# Patient Record
Sex: Female | Born: 1990 | Hispanic: No | Marital: Married | Smoking: Never smoker
Health system: Southern US, Community
[De-identification: ages and names within clinical notes are randomized; demographics above are authoritative.]

---
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
## 2014-01-07 ENCOUNTER — Ambulatory Visit (INDEPENDENT_AMBULATORY_CARE_PROVIDER_SITE_OTHER): Payer: PRIVATE HEALTH INSURANCE | Admitting: Emergency Medicine

## 2014-01-07 VITALS — BP 114/68 | HR 64 | Temp 98.2°F | Resp 16 | Ht 67.5 in | Wt 188.2 lb

## 2014-01-07 DIAGNOSIS — J209 Acute bronchitis, unspecified: Secondary | ICD-10-CM

## 2014-01-07 DIAGNOSIS — J018 Other acute sinusitis: Secondary | ICD-10-CM

## 2014-01-07 MED ORDER — LEVOFLOXACIN 500 MG PO TABS: 500.0000 mg | ORAL_TABLET | Freq: Every day | ORAL | Status: AC

## 2014-01-07 MED ORDER — PSEUDOEPHEDRINE-GUAIFENESIN ER 60-600 MG PO TB12: 1.0000 | ORAL_TABLET | Freq: Two times a day (BID) | ORAL | Status: AC

## 2014-01-07 MED ORDER — PROMETHAZINE-CODEINE 6.25-10 MG/5ML PO SYRP: 5.0000 mL | ORAL_SOLUTION | Freq: Four times a day (QID) | ORAL | Status: AC | PRN

## 2014-01-07 NOTE — Progress Notes (Signed)
[facility] [address], [place] [id number] [phone]  Date:  01/07/2014   Name:  Lisa Armstrong   DOB:  [birth year]   MRN:  [id number]  PCP:  Angelica ChessmanAGUIAR,RAFAELA M., MD    Chief Complaint: Headache, Cough and Sore Throat   History of Present Illness:  Lisa Armstrong is a 23 y.o. very pleasant female patient who presents with the following:  1 1/2 week history of pain in throat and ears.  Has nasal congestion and postnasal drip but no drainage.  Cough that is not productive until today and now has a purulent sputum.  No wheezing or shortness of breath.  No fever or chills.  No nausea or vomiting no stool change.  No improvement with over the counter medications or other home remedies. Denies other complaint or health concern today.   There are no active problems to display for this patient.   History reviewed. No pertinent past medical history.  History reviewed. No pertinent past surgical history.  History  Substance Use Topics  . Smoking status: Never Smoker   . Smokeless tobacco: Not on file  . Alcohol Use: Yes    Family History  Problem Relation Age of Onset  . Hyperlipidemia Mother     Allergies  Allergen Reactions  . Penicillins     Medication list has been reviewed and updated.  No current outpatient prescriptions on file prior to visit.   No current facility-administered medications on file prior to visit.    Review of Systems:  As per HPI, otherwise negative.    Physical Examination: Filed Vitals:   01/07/14 0944  BP: 114/68  Pulse: 64  Temp: 98.2 F (36.8 C)  Resp: 16   Filed Vitals:   01/07/14 0944  Height: 5' 7.5" (1.715 m)  Weight: 188 lb 3.2 oz (85.367 kg)   Body mass index is 29.02 kg/(m^2). Ideal Body Weight: Weight in (lb) to have BMI = 25: 161.7  GEN: WDWN, NAD, Non-toxic, A & O x 3 HEENT: Atraumatic, Normocephalic. Neck supple. No masses, No LAD. Ears and Nose: No external deformity. CV: RRR, No M/G/R.  No JVD. No thrill. No extra heart sounds. PULM: CTA B, no wheezes, crackles, rhonchi. No retractions. No resp. distress. No accessory muscle use. ABD: S, NT, ND, +BS. No rebound. No HSM. EXTR: No c/c/e NEURO Normal gait.  PSYCH: Normally interactive. Conversant. Not depressed or anxious appearing.  Calm demeanor.    Assessment and Plan: Sinusitis Bronchitis levaquin mucinex d Phen c cod  Signed,  Phillips OdorJeffery Jessi Jessop, MD

## 2014-01-07 NOTE — Patient Instructions (Signed)

## 2015-12-27 ENCOUNTER — Emergency Department (HOSPITAL_COMMUNITY): Payer: 59

## 2015-12-27 ENCOUNTER — Encounter (HOSPITAL_COMMUNITY): Payer: Self-pay

## 2015-12-27 ENCOUNTER — Telehealth: Payer: Self-pay | Admitting: Surgery

## 2015-12-27 ENCOUNTER — Emergency Department (HOSPITAL_COMMUNITY)
Admission: EM | Admit: 2015-12-27 | Discharge: 2015-12-27 | Disposition: A | Discharge status: Home / Self Care | Payer: 59 | Attending: Emergency Medicine | Admitting: Emergency Medicine

## 2015-12-27 DIAGNOSIS — Z3202 Encounter for pregnancy test, result negative: Secondary | ICD-10-CM | POA: Insufficient documentation

## 2015-12-27 DIAGNOSIS — Z880 Allergy status to penicillin: Secondary | ICD-10-CM | POA: Diagnosis not present

## 2015-12-27 DIAGNOSIS — N390 Urinary tract infection, site not specified: Secondary | ICD-10-CM

## 2015-12-27 DIAGNOSIS — R197 Diarrhea, unspecified: Secondary | ICD-10-CM | POA: Diagnosis not present

## 2015-12-27 DIAGNOSIS — R112 Nausea with vomiting, unspecified: Secondary | ICD-10-CM | POA: Insufficient documentation

## 2015-12-27 DIAGNOSIS — R109 Unspecified abdominal pain: Secondary | ICD-10-CM | POA: Diagnosis present

## 2015-12-27 LAB — COMPREHENSIVE METABOLIC PANEL
ALK PHOS: 68 U/L (ref 38–126)
ALT: 16 U/L (ref 14–54)
ANION GAP: 9 (ref 5–15)
AST: 19 U/L (ref 15–41)
Albumin: 4.3 g/dL (ref 3.5–5.0)
BUN: 14 mg/dL (ref 6–20)
CALCIUM: 9.2 mg/dL (ref 8.9–10.3)
CO2: 29 mmol/L (ref 22–32)
CREATININE: 1.13 mg/dL — AB (ref 0.44–1.00)
Chloride: 101 mmol/L (ref 101–111)
Glucose, Bld: 105 mg/dL — ABNORMAL HIGH (ref 65–99)
Potassium: 3.8 mmol/L (ref 3.5–5.1)
SODIUM: 139 mmol/L (ref 135–145)
TOTAL PROTEIN: 8 g/dL (ref 6.5–8.1)
Total Bilirubin: 0.7 mg/dL (ref 0.3–1.2)

## 2015-12-27 LAB — CBC
HCT: 47.1 % — ABNORMAL HIGH (ref 36.0–46.0)
HEMOGLOBIN: 15.2 g/dL — AB (ref 12.0–15.0)
MCH: 29.7 pg (ref 26.0–34.0)
MCHC: 32.3 g/dL (ref 30.0–36.0)
MCV: 92 fL (ref 78.0–100.0)
PLATELETS: 333 10*3/uL (ref 150–400)
RBC: 5.12 MIL/uL — AB (ref 3.87–5.11)
RDW: 12.3 % (ref 11.5–15.5)
WBC: 6.5 10*3/uL (ref 4.0–10.5)

## 2015-12-27 LAB — URINALYSIS, ROUTINE W REFLEX MICROSCOPIC
Glucose, UA: NEGATIVE mg/dL
Ketones, ur: NEGATIVE mg/dL
Leukocytes, UA: NEGATIVE
Nitrite: NEGATIVE
Protein, ur: 30 mg/dL — AB
Specific Gravity, Urine: 1.036 — ABNORMAL HIGH (ref 1.005–1.030)
pH: 6 (ref 5.0–8.0)

## 2015-12-27 LAB — URINE MICROSCOPIC-ADD ON

## 2015-12-27 LAB — LIPASE, BLOOD: Lipase: 27 U/L (ref 11–51)

## 2015-12-27 LAB — POC URINE PREG, ED: Preg Test, Ur: NEGATIVE

## 2015-12-27 MED ORDER — IOPAMIDOL (ISOVUE-300) INJECTION 61%
100.0000 mL | Freq: Once | INTRAVENOUS | Status: AC | PRN
  Administered 2015-12-27: 100 mL via INTRAVENOUS

## 2015-12-27 MED ORDER — ONDANSETRON HCL 4 MG/2ML IJ SOLN
4.0000 mg | Freq: Once | INTRAMUSCULAR | Status: AC
  Administered 2015-12-27: 4 mg via INTRAVENOUS
  Filled 2015-12-27: qty 2

## 2015-12-27 MED ORDER — IOHEXOL 300 MG/ML  SOLN
25.0000 mL | Freq: Once | INTRAMUSCULAR | Status: AC | PRN
Start: 2015-12-27 — End: 2015-12-27
  Administered 2015-12-27: 25 mL via ORAL

## 2015-12-27 MED ORDER — CEPHALEXIN 500 MG PO CAPS: 500.0000 mg | ORAL_CAPSULE | Freq: Four times a day (QID) | ORAL | Status: DC

## 2015-12-27 MED ORDER — SODIUM CHLORIDE 0.9 % IV SOLN
INTRAVENOUS | Status: DC
  Administered 2015-12-27: 14:00:00 via INTRAVENOUS

## 2015-12-27 MED ORDER — MORPHINE SULFATE (PF) 4 MG/ML IV SOLN
4.0000 mg | Freq: Once | INTRAVENOUS | Status: AC
  Administered 2015-12-27: 4 mg via INTRAVENOUS
  Filled 2015-12-27: qty 1

## 2015-12-27 MED ORDER — SODIUM CHLORIDE 0.9 % IV BOLUS (SEPSIS)
500.0000 mL | Freq: Once | INTRAVENOUS | Status: AC
  Administered 2015-12-27: 500 mL via INTRAVENOUS

## 2015-12-27 NOTE — Discharge Instructions (Signed)

## 2015-12-27 NOTE — Telephone Encounter (Signed)
ED CM received [facility] regarding prescription written today for Keflex. Clarified Keflex prescription written for patient. By EDP. No further questions or concerns noted.

## 2015-12-27 NOTE — ED Provider Notes (Signed)
CSN: [id number]     Arrival date & time 12/27/15  4098 History   First MD Initiated Contact with Patient 12/27/15 1151     Chief Complaint  Patient presents with  . Abdominal Pain  . Emesis     (Consider location/radiation/quality/duration/timing/severity/associated sxs/prior Treatment) The history is provided by the patient.     Lisa Armstrong is a 25 y.o. female who presents for evaluation of low abdominal pain, bilateral, radiating to her lower back, present for 3 days. She had some diarrhea which improved after taking Imodium. She has had nausea with some vomiting, but none today. She saw a PCP yesterday and was started on Septra and Zofran for a suspected bladder infection. Her symptoms have continued today, so she came here. She checked her temperature at home, and it was normal. She denies cough, chest pain, weakness or dizziness. No prior similar problem. No history of kidney stones. She works in Engineering geologist. There are no other known modifying factors.  History reviewed. No pertinent past medical history. History reviewed. No pertinent past surgical history. Family History  Problem Relation Age of Onset  . Hyperlipidemia Mother    Social History  Substance Use Topics  . Smoking status: Never Smoker   . Smokeless tobacco: None  . Alcohol Use: Yes   OB History    No data available     Review of Systems  All other systems reviewed and are negative.     Allergies  Penicillins  Home Medications   Prior to Admission medications   Medication Sig Start Date End Date Taking? Authorizing Provider  levonorgestrel (MIRENA) 20 MCG/24HR IUD 1 each by Intrauterine route once.   Yes Historical Provider, MD  ondansetron (ZOFRAN-ODT) 8 MG disintegrating tablet Take 8 mg by mouth every 8 (eight) hours as needed for nausea or vomiting.   Yes Historical Provider, MD  cephALEXin (KEFLEX) 500 MG capsule Take 1 capsule (500 mg total) by mouth 4 (four) times daily. 12/27/15   Mancel Bale,  MD  promethazine-codeine (PHENERGAN WITH CODEINE) 6.25-10 MG/5ML syrup Take 5-10 mLs by mouth every 6 (six) hours as needed. Patient not taking: Reported on 12/27/2015 01/07/14   Carmelina Dane, MD   BP 139/70 mmHg  Pulse 68  Temp(Src) 98.1 F (36.7 C) (Oral)  Resp 18  SpO2 96%  LMP 12/18/2015 (Approximate) Physical Exam  Constitutional: She is oriented to person, place, and time. She appears well-developed and well-nourished. No distress.  HENT:  Head: Normocephalic and atraumatic.  Right Ear: External ear normal.  Left Ear: External ear normal.  Eyes: Conjunctivae and EOM are normal. Pupils are equal, round, and reactive to light.  Neck: Normal range of motion and phonation normal. Neck supple.  Cardiovascular: Normal rate, regular rhythm and normal heart sounds.   Pulmonary/Chest: Effort normal and breath sounds normal. She exhibits no bony tenderness.  Abdominal: Soft. She exhibits no distension and no mass. There is tenderness (Bilateral lower quadrants, mild). There is no rebound and no guarding.  Musculoskeletal: Normal range of motion. She exhibits no edema or tenderness.  Neurological: She is alert and oriented to person, place, and time. No cranial nerve deficit or sensory deficit. She exhibits normal muscle tone. Coordination normal.  Skin: Skin is warm, dry and intact.  Psychiatric: She has a normal mood and affect. Her behavior is normal. Judgment and thought content normal.  Nursing note and vitals reviewed.   ED Course  Procedures (including critical care time)  Medications  0.9 %  sodium  chloride infusion ( Intravenous Stopped 12/27/15 1550)  sodium chloride 0.9 % bolus 500 mL (0 mLs Intravenous Stopped 12/27/15 1348)  ondansetron (ZOFRAN) injection 4 mg (4 mg Intravenous Given 12/27/15 1230)  morphine 4 MG/ML injection 4 mg (4 mg Intravenous Given 12/27/15 1230)  iohexol (OMNIPAQUE) 300 MG/ML solution 25 mL (25 mLs Oral Contrast Given 12/27/15 1315)  iopamidol  (ISOVUE-300) 61 % injection 100 mL (100 mLs Intravenous Contrast Given 12/27/15 1420)    Patient Vitals for the past 24 hrs:  BP Temp Temp src Pulse Resp SpO2  12/27/15 1531 139/70 mmHg 98.1 F (36.7 C) Oral 68 18 96 %  12/27/15 1301 130/78 mmHg 98.3 F (36.8 C) Oral 99 18 100 %  12/27/15 0854 129/96 mmHg 98.1 F (36.7 C) Oral 105 18 99 %    3:22 PM Reevaluation with update and discussion. After initial assessment and treatment, an updated evaluation reveals She is more comfort now has no further complaints. Findings discussed with patient and all questions were answered. Lisa Armstrong L    Labs Review Labs Reviewed  COMPREHENSIVE METABOLIC PANEL - Abnormal; Notable for the following:    Glucose, Bld 105 (*)    Creatinine, Ser 1.13 (*)    All other components within normal limits  CBC - Abnormal; Notable for the following:    RBC 5.12 (*)    Hemoglobin 15.2 (*)    HCT 47.1 (*)    All other components within normal limits  URINALYSIS, ROUTINE W REFLEX MICROSCOPIC (NOT AT [facility]) - Abnormal; Notable for the following:    Color, Urine AMBER (*)    APPearance CLOUDY (*)    Specific Gravity, Urine 1.036 (*)    Hgb urine dipstick MODERATE (*)    Bilirubin Urine SMALL (*)    Protein, ur 30 (*)    All other components within normal limits  URINE MICROSCOPIC-ADD ON - Abnormal; Notable for the following:    Squamous Epithelial / LPF 0-5 (*)    Bacteria, UA MANY (*)    All other components within normal limits  URINE CULTURE  LIPASE, BLOOD  POC URINE PREG, ED    Imaging Review Ct Abdomen Pelvis W Contrast  12/27/2015  CLINICAL DATA:  Abdominal pain, vomiting and diarrhea since 12/24/2015. Hematuria. Initial encounter. EXAM: CT ABDOMEN AND PELVIS WITH CONTRAST TECHNIQUE: Multidetector CT imaging of the abdomen and pelvis was performed using the standard protocol following bolus administration of intravenous contrast. CONTRAST:  100 ml ISOVUE-300 IOPAMIDOL (ISOVUE-300) INJECTION 61%, 25  mL OMNIPAQUE IOHEXOL 300 MG/ML SOLN COMPARISON:  None. FINDINGS: Mild dependent atelectasis is seen in the lung bases. No pleural or pericardial effusion. Heart size is normal. A cyst in the midpole of the left kidney measures 0.9 cm in diameter. The kidneys otherwise appear normal. No hydronephrosis or urinary tract stones are identified. Ureters, urinary bladder and adnexa are unremarkable. IUD is in place. The liver, gallbladder, spleen, adrenal glands, pancreas and biliary tree all appear normal. The stomach, small and large bowel and appendix are unremarkable. No lymphadenopathy or fluid is identified. No bony abnormality is seen. IMPRESSION: Negative CT abdomen and pelvis. No finding to explain the patient's symptoms. Electronically Signed   By: Drusilla Kanner M.D.   On: 12/27/2015 14:35   I have personally reviewed and evaluated these images and lab results as part of my medical decision-making.   EKG Interpretation None      MDM   Final diagnoses:  Urinary tract infection without hematuria, site unspecified  Urinary tract infection without evidence for metabolic instability, sepsis or impending vascular collapse. Doubt pyelonephritis.   Nursing Notes Reviewed/ Care Coordinated Applicable Imaging Reviewed Interpretation of Laboratory Data incorporated into ED treatment  The patient appears reasonably screened and/or stabilized for discharge and I doubt any other medical condition or other [facility] requiring further screening, evaluation, or treatment in the ED at this time prior to discharge.  Plan: Home Medications- change medication to Keflex, DC Septra; Home Treatments- rest, gradually advance diet; return here if the recommended treatment, does not improve the symptoms; Recommended follow up- PCP for checkup in one week     Mancel BaleElliott Ladajah Soltys, MD 12/27/15 1553

## 2015-12-27 NOTE — ED Notes (Signed)
Pt presents with c/o abdominal pain, vomiting, and diarrhea that started on Saturday. Pt went to the doctor yesterday and was given nausea medication and reports that they detected some blood in her urine. Pt was given antibiotics for a possible bladder infection. Pt reports the symptoms have not resolved.

## 2015-12-28 LAB — URINE CULTURE: Special Requests: NORMAL

## 2017-03-08 IMAGING — CT CT ABD-PELV W/ CM
2 of 4 series · 17 of 46 positions shown, 19 images · IV contrast (OMNIPAQUE 300)
Comparison: None.

CLINICAL DATA: Abdominal pain, vomiting and diarrhea since
12/24/2015. Hematuria. Initial encounter.

EXAM:
CT ABDOMEN AND PELVIS WITH CONTRAST
TECHNIQUE: Multidetector CT imaging of the abdomen and pelvis was performed
using the standard protocol following bolus administration of
intravenous contrast.
CONTRAST:  100 ml HLEELR-QLL IOPAMIDOL (HLEELR-QLL) INJECTION 61%,
25 mL OMNIPAQUE IOHEXOL 300 MG/ML SOLN

[Series 2: abd/pel with · axial · 0.74mm/px · z∈[+1360,+1800]mm · 14 of 96 slices shown, 16 images]
[im 4/96  soft-tissue]
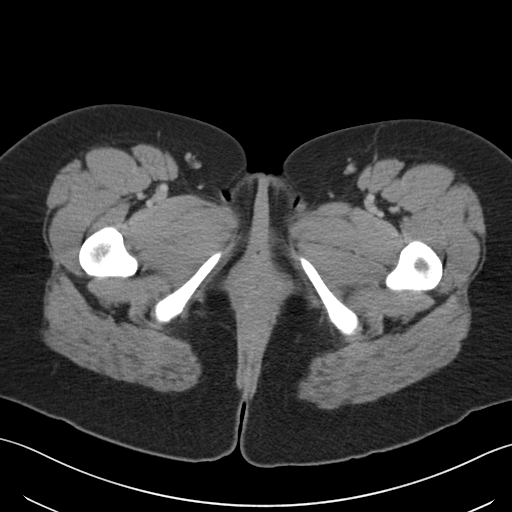
[im 4/96  bone]
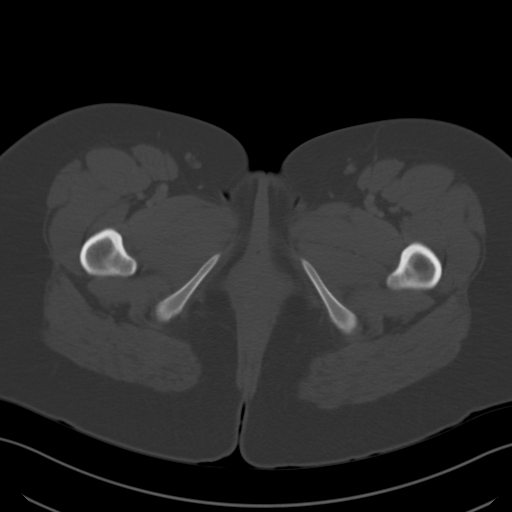
[im 12/96  soft-tissue]
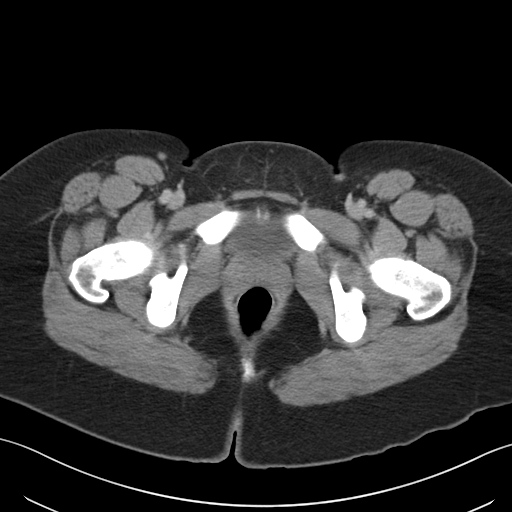
[im 20/96  soft-tissue]
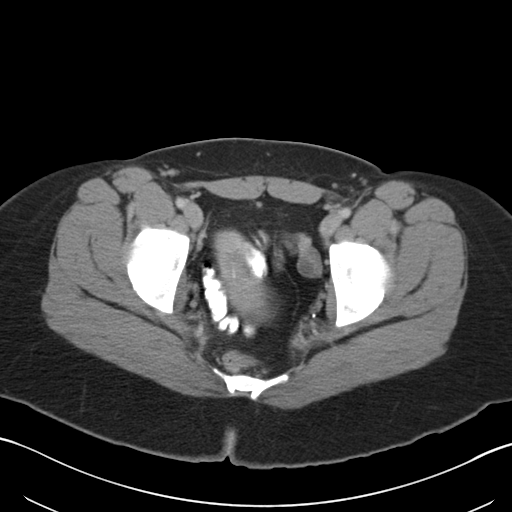
[im 27/96  soft-tissue]
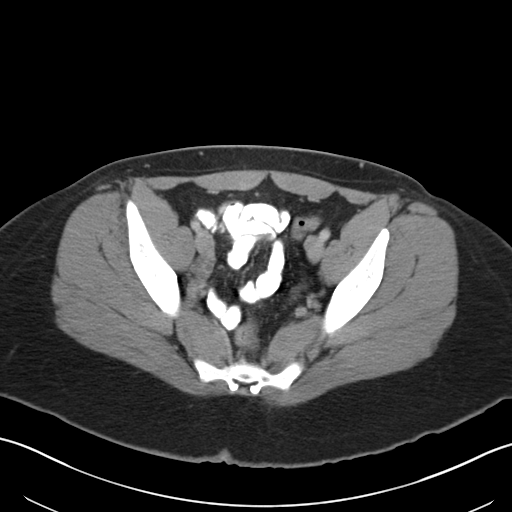
[im 31/96  soft-tissue]
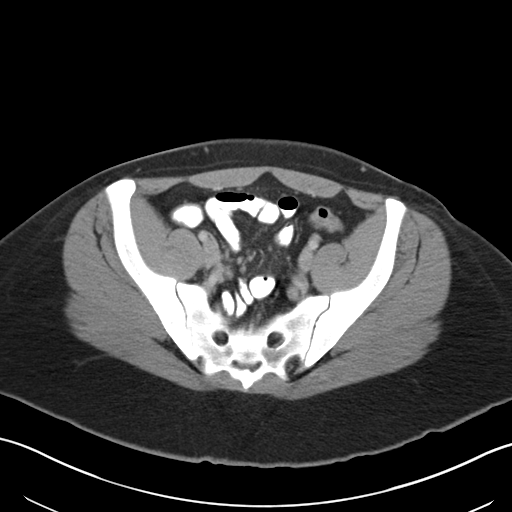
[im 39/96  soft-tissue]
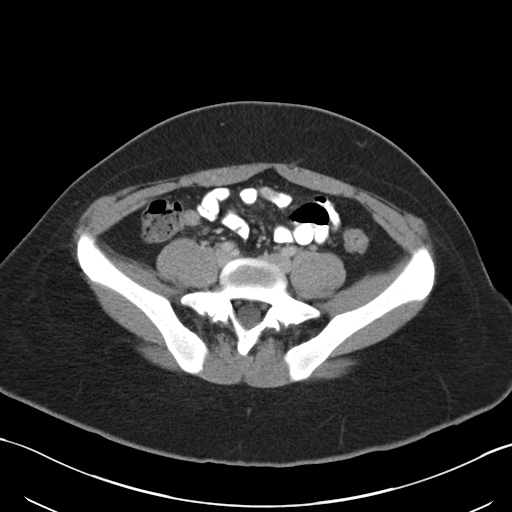
[im 46/96  soft-tissue]
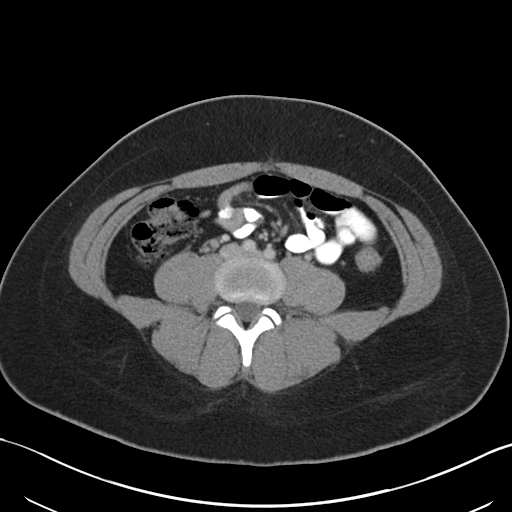
[im 50/96  soft-tissue]
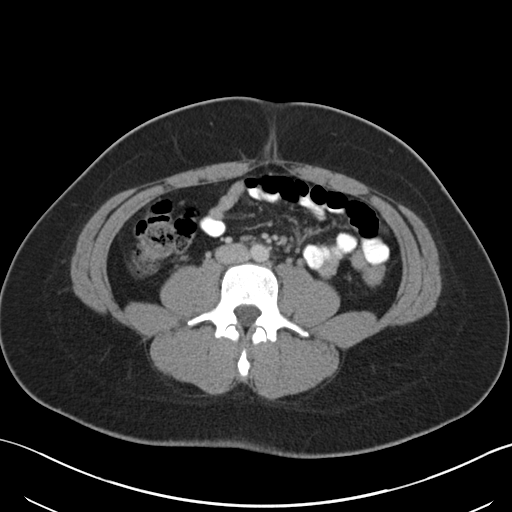
[im 58/96  soft-tissue]
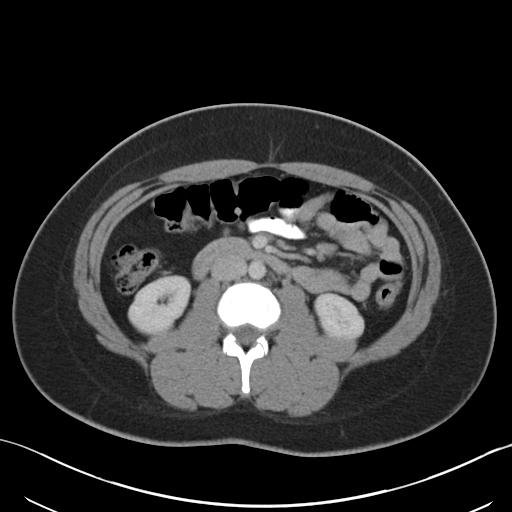
[im 58/96  bone]
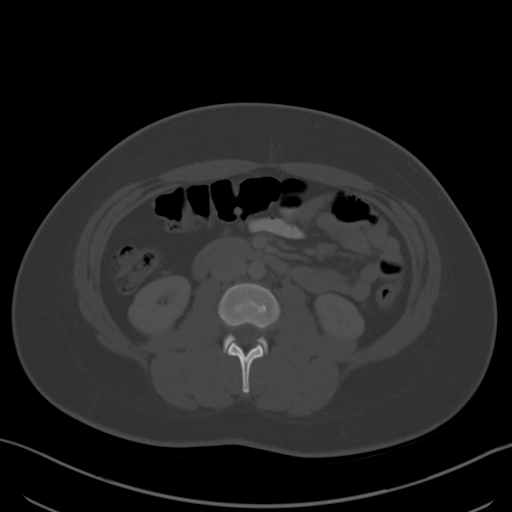
[im 65/96  soft-tissue]
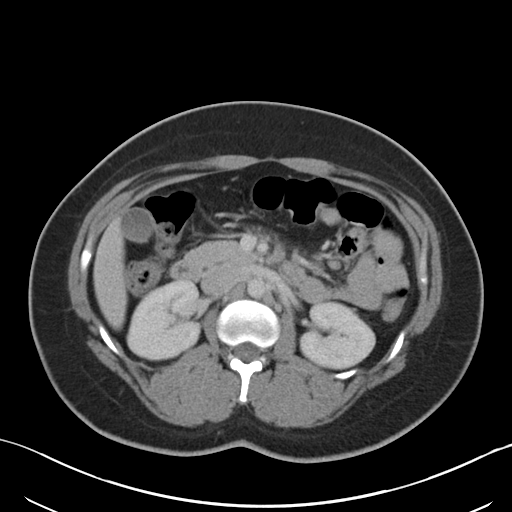
[im 73/96  soft-tissue]
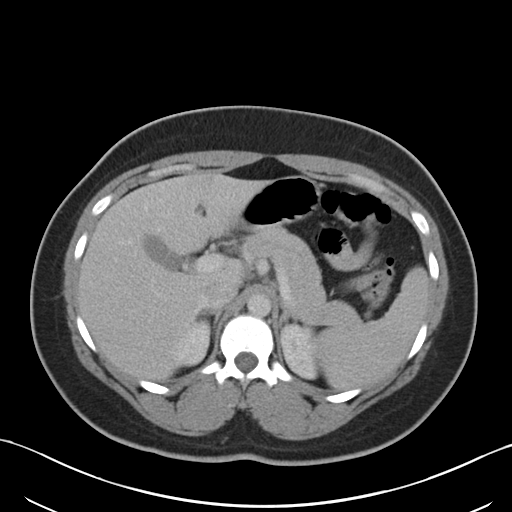
[im 77/96  soft-tissue]
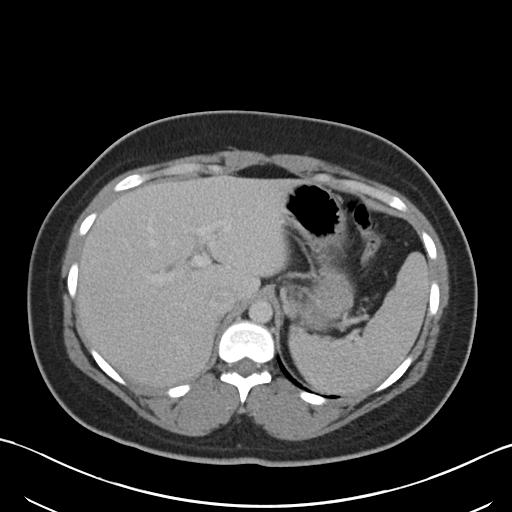
[im 84/96  soft-tissue]
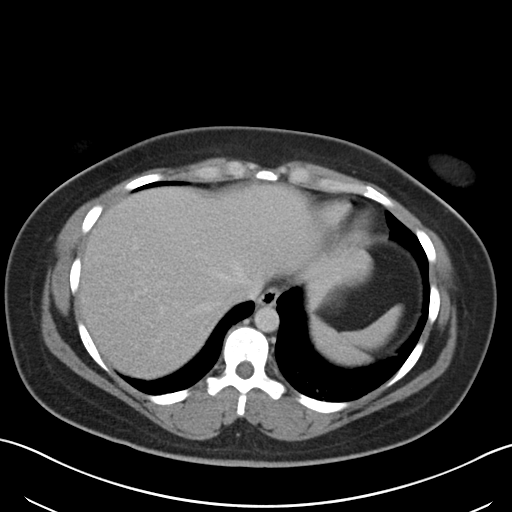
[im 92/96  soft-tissue]
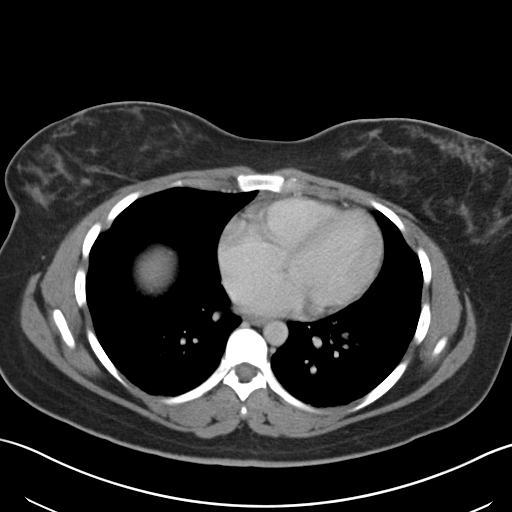

[Series 3: coronal a/|p · coronal · 0.91mm/px · 3 of 67 slices shown]
[im 23/67  soft-tissue]
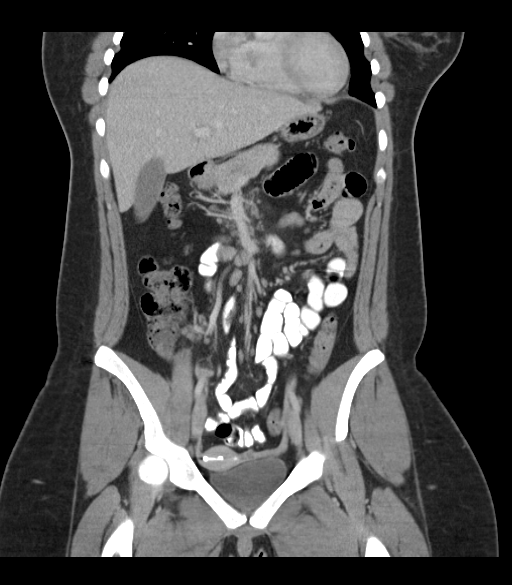
[im 30/67  soft-tissue]
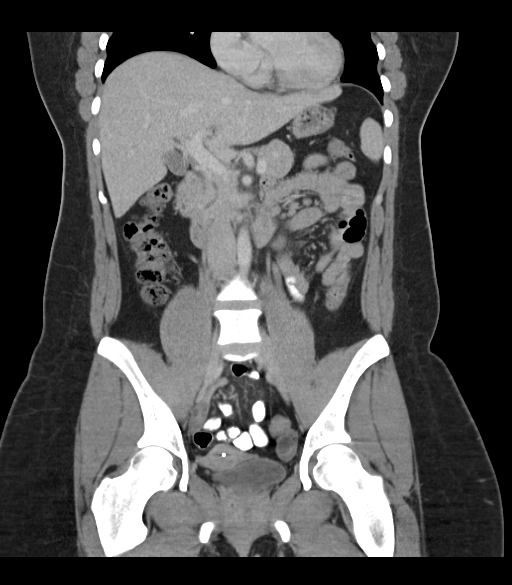
[im 37/67  soft-tissue]
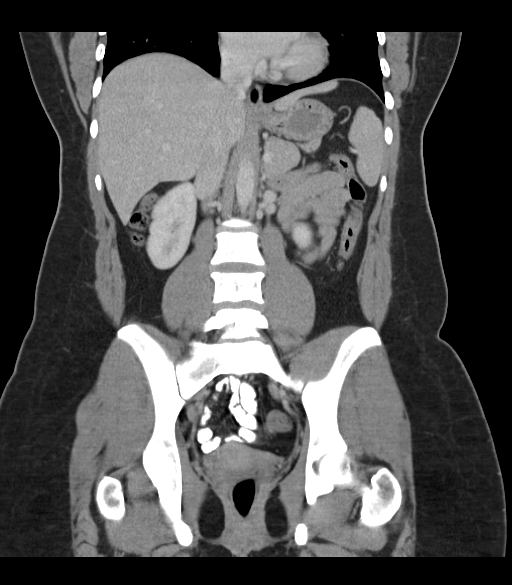

[17 of 46 positions shown; findings below may reference images not displayed]

FINDINGS: Mild dependent atelectasis is seen in the lung bases. No pleural or
pericardial effusion. Heart size is normal.

A cyst in the midpole of the left kidney measures 0.9 cm in
diameter. The kidneys otherwise appear normal. No hydronephrosis or
urinary tract stones are identified. Ureters, urinary bladder and
adnexa are unremarkable. IUD is in place.

The liver, gallbladder, spleen, adrenal glands, pancreas and biliary
tree all appear normal. The stomach, small and large bowel and
appendix are unremarkable. No lymphadenopathy or fluid is
identified.

No bony abnormality is seen.
IMPRESSION: Negative CT abdomen and pelvis. No finding to explain the patient's
symptoms.

## 2017-05-27 ENCOUNTER — Emergency Department (HOSPITAL_COMMUNITY)
Admission: EM | Admit: 2017-05-27 | Discharge: 2017-05-27 | Disposition: A | Discharge status: Home / Self Care | Payer: No Typology Code available for payment source | Attending: Emergency Medicine | Admitting: Emergency Medicine

## 2017-05-27 DIAGNOSIS — Z880 Allergy status to penicillin: Secondary | ICD-10-CM | POA: Insufficient documentation

## 2017-05-27 DIAGNOSIS — Y92411 Interstate highway as the place of occurrence of the external cause: Secondary | ICD-10-CM | POA: Insufficient documentation

## 2017-05-27 DIAGNOSIS — Y999 Unspecified external cause status: Secondary | ICD-10-CM | POA: Insufficient documentation

## 2017-05-27 DIAGNOSIS — R1010 Upper abdominal pain, unspecified: Secondary | ICD-10-CM | POA: Insufficient documentation

## 2017-05-27 DIAGNOSIS — Y939 Activity, unspecified: Secondary | ICD-10-CM | POA: Insufficient documentation

## 2017-05-27 DIAGNOSIS — M542 Cervicalgia: Secondary | ICD-10-CM | POA: Diagnosis present

## 2017-05-27 DIAGNOSIS — M545 Low back pain: Secondary | ICD-10-CM | POA: Diagnosis not present

## 2017-05-27 MED ORDER — NAPROXEN 500 MG PO TABS: 500.0000 mg | ORAL_TABLET | Freq: Two times a day (BID) | ORAL | 0 refills | Status: AC

## 2017-05-27 MED ORDER — CYCLOBENZAPRINE HCL 10 MG PO TABS
10.0000 mg | ORAL_TABLET | Freq: Two times a day (BID) | ORAL | 0 refills | Status: AC | PRN
Start: 2017-05-27 — End: ?

## 2017-05-27 NOTE — ED Notes (Signed)
Patient is A&Ox4 at this time.  Patient in no signs of distress.  Please see providers note for complete history and physical exam.  

## 2017-05-27 NOTE — ED Notes (Signed)
Patient Alert and oriented X4. Stable and ambulatory. Patient verbalized understanding of the discharge instructions.  Patient belongings were taken by the patient.  
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
## 2020-12-27 DIAGNOSIS — H00011 Hordeolum externum right upper eyelid: Secondary | ICD-10-CM | POA: Diagnosis not present

## 2021-07-26 DIAGNOSIS — E669 Obesity, unspecified: Secondary | ICD-10-CM | POA: Diagnosis not present

## 2021-07-26 DIAGNOSIS — F411 Generalized anxiety disorder: Secondary | ICD-10-CM | POA: Diagnosis not present

## 2021-07-26 DIAGNOSIS — F331 Major depressive disorder, recurrent, moderate: Secondary | ICD-10-CM | POA: Diagnosis not present

## 2021-07-26 DIAGNOSIS — R7303 Prediabetes: Secondary | ICD-10-CM | POA: Diagnosis not present

## 2021-08-10 ENCOUNTER — Ambulatory Visit (INDEPENDENT_AMBULATORY_CARE_PROVIDER_SITE_OTHER): Payer: BLUE CROSS/BLUE SHIELD | Admitting: Podiatry

## 2021-08-10 ENCOUNTER — Encounter: Payer: Self-pay | Admitting: Podiatry

## 2021-08-10 ENCOUNTER — Other Ambulatory Visit: Payer: Self-pay

## 2021-08-10 DIAGNOSIS — L03032 Cellulitis of left toe: Secondary | ICD-10-CM | POA: Diagnosis not present

## 2021-08-10 DIAGNOSIS — L03031 Cellulitis of right toe: Secondary | ICD-10-CM | POA: Diagnosis not present

## 2021-08-10 MED ORDER — CEPHALEXIN 500 MG PO CAPS: 500.0000 mg | ORAL_CAPSULE | Freq: Four times a day (QID) | ORAL | 0 refills | Status: AC

## 2021-08-10 NOTE — Progress Notes (Signed)
  Subjective:  Patient ID: Lisa Armstrong, female    DOB: Mar 21, 1991,   MRN: [id number]  Chief Complaint  Patient presents with   Nail Problem    Bilateral great toe pain     30 y.o. female presents for concern of discolored nails and pain. Relates she was wearing heels on Halloween and may have irritated her toes. States she has been icing with relief. Concerned for discoloration and pain . Denies any other pedal complaints. Denies n/v/f/c.   History reviewed. No pertinent past medical history.  Objective:  Physical Exam: Vascular: DP/PT pulses 2/4 bilateral. CFT <3 seconds. Normal hair growth on digits. No edema.  Skin. No lacerations or abrasions bilateral feet. Erythema and edema noted to proximal nail fold bilateral nails. No purulence noted. No incurvation of the nail noted.  Musculoskeletal: MMT 5/5 bilateral lower extremities in DF, PF, Inversion and Eversion. Deceased ROM in DF of ankle joint.  Neurological: Sensation intact to light touch.   Assessment:   1. Paronychia of great toe of left foot   2. Paronychia of great toe of right foot      Plan:  Patient was evaluated and treated and all questions answered. Discussed paronychia and infection of the nail today. Infection appears minimal so recommend today just trying antibiotics.  Discussed nail procedure with patient and do not recommend removal of nail plate today.  Prescription for keflex provided.   Patient will monitor for any worsening.  To follow-up as needed.      Louann Sjogren, DPM

## 2021-08-16 ENCOUNTER — Telehealth: Payer: Self-pay | Admitting: *Deleted

## 2021-08-16 ENCOUNTER — Other Ambulatory Visit: Payer: Self-pay | Admitting: Podiatry

## 2021-08-16 MED ORDER — FLUCONAZOLE 150 MG PO TABS: 150.0000 mg | ORAL_TABLET | Freq: Once | ORAL | 0 refills | Status: AC

## 2021-08-16 NOTE — Telephone Encounter (Signed)
Patient is calling because the antibiotic (Cephalexin-500 mg,x 4 daily)prescribed is causing a yeast infection.she has tried taken OTC meds to try to help but does not since she is continuing to take the antibiotic. She also wanted to let the physician know that her toe is still bruised but pain has gone. Please advise.

## 2021-08-16 NOTE — Telephone Encounter (Signed)
Returned the call to patient,giving Dr Dionne Bucy recommendations and that another  prescription has been sent to pharmacy,verbalized understanding.

## 2021-08-24 DIAGNOSIS — R7303 Prediabetes: Secondary | ICD-10-CM | POA: Diagnosis not present

## 2021-08-24 DIAGNOSIS — E669 Obesity, unspecified: Secondary | ICD-10-CM | POA: Diagnosis not present

## 2021-08-24 DIAGNOSIS — E559 Vitamin D deficiency, unspecified: Secondary | ICD-10-CM | POA: Diagnosis not present

## 2021-08-30 DIAGNOSIS — R7303 Prediabetes: Secondary | ICD-10-CM | POA: Diagnosis not present

## 2021-08-30 DIAGNOSIS — F411 Generalized anxiety disorder: Secondary | ICD-10-CM | POA: Diagnosis not present

## 2021-08-30 DIAGNOSIS — F331 Major depressive disorder, recurrent, moderate: Secondary | ICD-10-CM | POA: Diagnosis not present

## 2021-08-30 DIAGNOSIS — E559 Vitamin D deficiency, unspecified: Secondary | ICD-10-CM | POA: Diagnosis not present

## 2021-09-04 DIAGNOSIS — F419 Anxiety disorder, unspecified: Secondary | ICD-10-CM | POA: Diagnosis not present

## 2021-09-04 DIAGNOSIS — Z01419 Encounter for gynecological examination (general) (routine) without abnormal findings: Secondary | ICD-10-CM | POA: Diagnosis not present

## 2021-09-08 DIAGNOSIS — Z20822 Contact with and (suspected) exposure to covid-19: Secondary | ICD-10-CM | POA: Diagnosis not present

## 2021-09-08 DIAGNOSIS — J069 Acute upper respiratory infection, unspecified: Secondary | ICD-10-CM | POA: Diagnosis not present

## 2021-09-08 DIAGNOSIS — F411 Generalized anxiety disorder: Secondary | ICD-10-CM | POA: Diagnosis not present

## 2021-09-08 DIAGNOSIS — J329 Chronic sinusitis, unspecified: Secondary | ICD-10-CM | POA: Diagnosis not present

## 2021-09-08 DIAGNOSIS — B9689 Other specified bacterial agents as the cause of diseases classified elsewhere: Secondary | ICD-10-CM | POA: Diagnosis not present

## 2021-11-01 DIAGNOSIS — F411 Generalized anxiety disorder: Secondary | ICD-10-CM | POA: Diagnosis not present

## 2021-11-01 DIAGNOSIS — F331 Major depressive disorder, recurrent, moderate: Secondary | ICD-10-CM | POA: Diagnosis not present

## 2021-11-01 DIAGNOSIS — G4700 Insomnia, unspecified: Secondary | ICD-10-CM | POA: Diagnosis not present

## 2021-12-08 DIAGNOSIS — F331 Major depressive disorder, recurrent, moderate: Secondary | ICD-10-CM | POA: Diagnosis not present

## 2021-12-08 DIAGNOSIS — M25561 Pain in right knee: Secondary | ICD-10-CM | POA: Diagnosis not present

## 2021-12-08 DIAGNOSIS — F411 Generalized anxiety disorder: Secondary | ICD-10-CM | POA: Diagnosis not present

## 2021-12-08 DIAGNOSIS — G4700 Insomnia, unspecified: Secondary | ICD-10-CM | POA: Diagnosis not present

## 2021-12-14 ENCOUNTER — Ambulatory Visit
Admission: RE | Admit: 2021-12-14 | Discharge: 2021-12-14 | Disposition: A | Discharge status: Home / Self Care | Payer: BC Managed Care – PPO | Source: Ambulatory Visit | Attending: Nurse Practitioner | Admitting: Nurse Practitioner

## 2021-12-14 ENCOUNTER — Other Ambulatory Visit: Payer: Self-pay | Admitting: Nurse Practitioner

## 2021-12-14 DIAGNOSIS — M25561 Pain in right knee: Secondary | ICD-10-CM

## 2021-12-14 DIAGNOSIS — G8929 Other chronic pain: Secondary | ICD-10-CM

## 2022-01-04 DIAGNOSIS — M5430 Sciatica, unspecified side: Secondary | ICD-10-CM | POA: Diagnosis not present

## 2022-01-04 DIAGNOSIS — M545 Low back pain, unspecified: Secondary | ICD-10-CM | POA: Diagnosis not present

## 2022-01-04 DIAGNOSIS — M1711 Unilateral primary osteoarthritis, right knee: Secondary | ICD-10-CM | POA: Diagnosis not present

## 2022-01-04 DIAGNOSIS — R030 Elevated blood-pressure reading, without diagnosis of hypertension: Secondary | ICD-10-CM | POA: Diagnosis not present

## 2022-05-16 DIAGNOSIS — L723 Sebaceous cyst: Secondary | ICD-10-CM | POA: Diagnosis not present

## 2022-05-16 DIAGNOSIS — Z319 Encounter for procreative management, unspecified: Secondary | ICD-10-CM | POA: Diagnosis not present

## 2022-05-24 DIAGNOSIS — Z13220 Encounter for screening for lipoid disorders: Secondary | ICD-10-CM | POA: Diagnosis not present

## 2022-05-24 DIAGNOSIS — Z114 Encounter for screening for human immunodeficiency virus [HIV]: Secondary | ICD-10-CM | POA: Diagnosis not present

## 2022-05-24 DIAGNOSIS — Z0000 Encounter for general adult medical examination without abnormal findings: Secondary | ICD-10-CM | POA: Diagnosis not present

## 2022-05-29 DIAGNOSIS — Z0000 Encounter for general adult medical examination without abnormal findings: Secondary | ICD-10-CM | POA: Diagnosis not present

## 2022-05-29 DIAGNOSIS — Z23 Encounter for immunization: Secondary | ICD-10-CM | POA: Diagnosis not present

## 2022-08-16 DIAGNOSIS — G4700 Insomnia, unspecified: Secondary | ICD-10-CM | POA: Diagnosis not present

## 2022-08-16 DIAGNOSIS — F411 Generalized anxiety disorder: Secondary | ICD-10-CM | POA: Diagnosis not present

## 2022-08-16 DIAGNOSIS — F331 Major depressive disorder, recurrent, moderate: Secondary | ICD-10-CM | POA: Diagnosis not present

## 2022-10-23 DIAGNOSIS — Z01419 Encounter for gynecological examination (general) (routine) without abnormal findings: Secondary | ICD-10-CM | POA: Diagnosis not present

## 2022-10-23 DIAGNOSIS — Z1151 Encounter for screening for human papillomavirus (HPV): Secondary | ICD-10-CM | POA: Diagnosis not present

## 2022-10-23 DIAGNOSIS — Z124 Encounter for screening for malignant neoplasm of cervix: Secondary | ICD-10-CM | POA: Diagnosis not present

## 2022-10-23 DIAGNOSIS — Z6838 Body mass index (BMI) 38.0-38.9, adult: Secondary | ICD-10-CM | POA: Diagnosis not present

## 2022-11-12 DIAGNOSIS — Z1159 Encounter for screening for other viral diseases: Secondary | ICD-10-CM | POA: Diagnosis not present

## 2022-11-12 DIAGNOSIS — U071 COVID-19: Secondary | ICD-10-CM | POA: Diagnosis not present

## 2022-11-12 DIAGNOSIS — J069 Acute upper respiratory infection, unspecified: Secondary | ICD-10-CM | POA: Diagnosis not present

## 2022-11-26 DIAGNOSIS — R112 Nausea with vomiting, unspecified: Secondary | ICD-10-CM | POA: Diagnosis not present

## 2023-02-06 DIAGNOSIS — G4700 Insomnia, unspecified: Secondary | ICD-10-CM | POA: Diagnosis not present

## 2023-02-06 DIAGNOSIS — F411 Generalized anxiety disorder: Secondary | ICD-10-CM | POA: Diagnosis not present

## 2023-02-06 DIAGNOSIS — F331 Major depressive disorder, recurrent, moderate: Secondary | ICD-10-CM | POA: Diagnosis not present

## 2023-06-06 DIAGNOSIS — N979 Female infertility, unspecified: Secondary | ICD-10-CM | POA: Diagnosis not present

## 2023-06-17 DIAGNOSIS — Z114 Encounter for screening for human immunodeficiency virus [HIV]: Secondary | ICD-10-CM | POA: Diagnosis not present

## 2023-06-17 DIAGNOSIS — Z13220 Encounter for screening for lipoid disorders: Secondary | ICD-10-CM | POA: Diagnosis not present

## 2023-06-17 DIAGNOSIS — Z0000 Encounter for general adult medical examination without abnormal findings: Secondary | ICD-10-CM | POA: Diagnosis not present

## 2023-06-18 DIAGNOSIS — Z23 Encounter for immunization: Secondary | ICD-10-CM | POA: Diagnosis not present

## 2023-06-18 DIAGNOSIS — R030 Elevated blood-pressure reading, without diagnosis of hypertension: Secondary | ICD-10-CM | POA: Diagnosis not present

## 2023-06-18 DIAGNOSIS — Z0000 Encounter for general adult medical examination without abnormal findings: Secondary | ICD-10-CM | POA: Diagnosis not present

## 2023-07-25 DIAGNOSIS — F331 Major depressive disorder, recurrent, moderate: Secondary | ICD-10-CM | POA: Diagnosis not present

## 2023-07-25 DIAGNOSIS — Z3169 Encounter for other general counseling and advice on procreation: Secondary | ICD-10-CM | POA: Diagnosis not present

## 2023-07-25 DIAGNOSIS — F411 Generalized anxiety disorder: Secondary | ICD-10-CM | POA: Diagnosis not present

## 2023-07-25 DIAGNOSIS — G4700 Insomnia, unspecified: Secondary | ICD-10-CM | POA: Diagnosis not present

## 2023-07-25 DIAGNOSIS — E559 Vitamin D deficiency, unspecified: Secondary | ICD-10-CM | POA: Diagnosis not present

## 2023-07-25 DIAGNOSIS — E288 Other ovarian dysfunction: Secondary | ICD-10-CM | POA: Diagnosis not present

## 2023-09-11 DIAGNOSIS — F411 Generalized anxiety disorder: Secondary | ICD-10-CM | POA: Diagnosis not present

## 2023-09-11 DIAGNOSIS — F331 Major depressive disorder, recurrent, moderate: Secondary | ICD-10-CM | POA: Diagnosis not present

## 2023-09-11 DIAGNOSIS — G4700 Insomnia, unspecified: Secondary | ICD-10-CM | POA: Diagnosis not present

## 2024-07-19 ENCOUNTER — Other Ambulatory Visit (HOSPITAL_COMMUNITY): Payer: Self-pay

## 2024-08-17 ENCOUNTER — Other Ambulatory Visit (HOSPITAL_COMMUNITY): Payer: Self-pay

## 2024-08-17 MED ORDER — VENLAFAXINE HCL ER 225 MG PO TB24
225.0000 mg | ORAL_TABLET | Freq: Every morning | ORAL | 1 refills | Status: DC
  Filled 2024-08-17: qty 30, 30d supply, fill #0

## 2024-08-18 ENCOUNTER — Other Ambulatory Visit (HOSPITAL_COMMUNITY): Payer: Self-pay
